# Patient Record
Sex: Female | Born: 2000 | Race: White | Hispanic: No | Marital: Single | Smoking: Never smoker
Health system: Southern US, Community
[De-identification: ages and names within clinical notes are randomized; demographics above are authoritative.]

## PROBLEM LIST (undated history)

## (undated) DIAGNOSIS — E538 Deficiency of other specified B group vitamins: Secondary | ICD-10-CM

## (undated) DIAGNOSIS — G43909 Migraine, unspecified, not intractable, without status migrainosus: Secondary | ICD-10-CM

## (undated) HISTORY — PX: NO PAST SURGERIES: SHX2092

## (undated) HISTORY — DX: Deficiency of other specified B group vitamins: E53.8

---
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
## 2018-01-21 ENCOUNTER — Encounter

## 2018-01-21 ENCOUNTER — Ambulatory Visit: Payer: Medicaid Other | Admitting: Neurology

## 2019-01-29 IMAGING — CT CT HEAD W/O CM
3 series · 16 of 47 positions shown, 19 images · non-contrast
Comparison: None.

CLINICAL DATA: Ataxia.  Suspect stroke.

EXAM:
CT HEAD WITHOUT CONTRAST
TECHNIQUE: Contiguous axial images were obtained from the base of the skull
through the vertex without intravenous contrast.

[Series 2: head wo · axial · 0.42mm/px · z∈[-207,-67]mm · 10 of 34 slices shown, 13 images]
[im 3/34  brain]
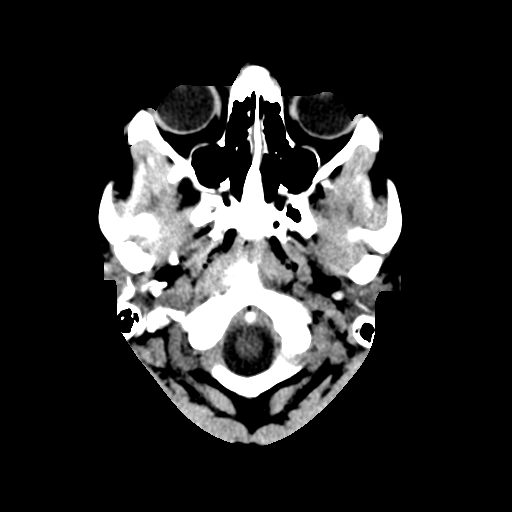
[im 3/34  bone]
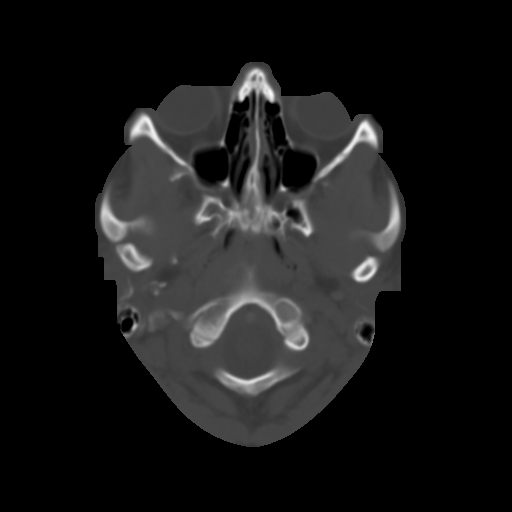
[im 6/34  brain]
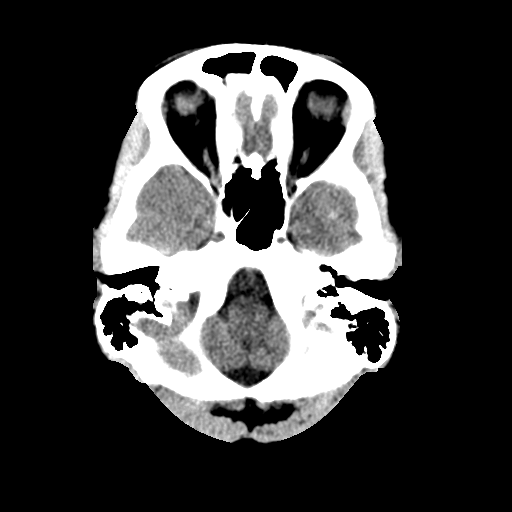
[im 10/34  brain]
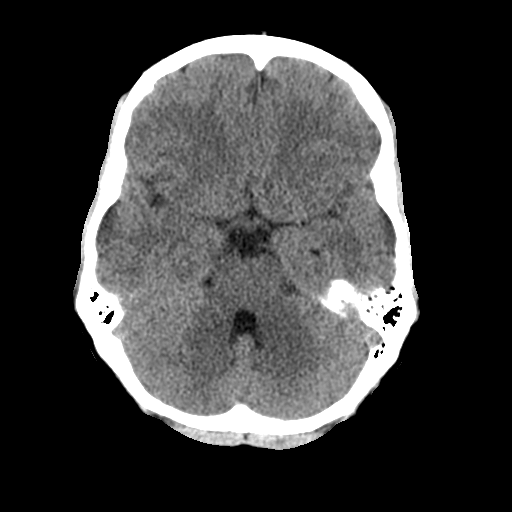
[im 12/34  brain]
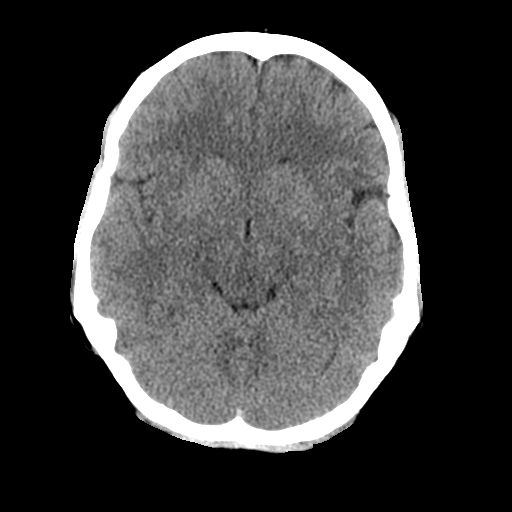
[im 15/34  brain]
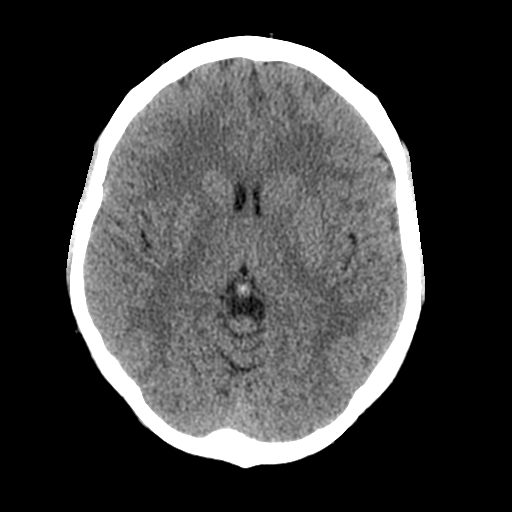
[im 15/34  bone]
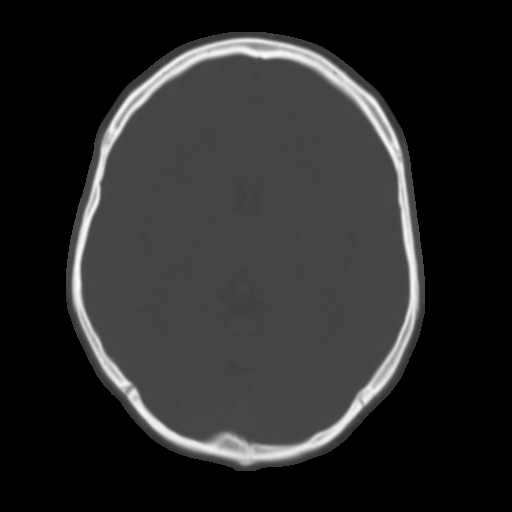
[im 19/34  brain]
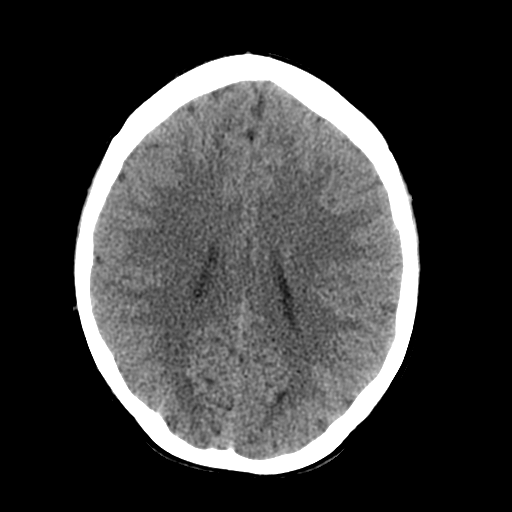
[im 22/34  brain]
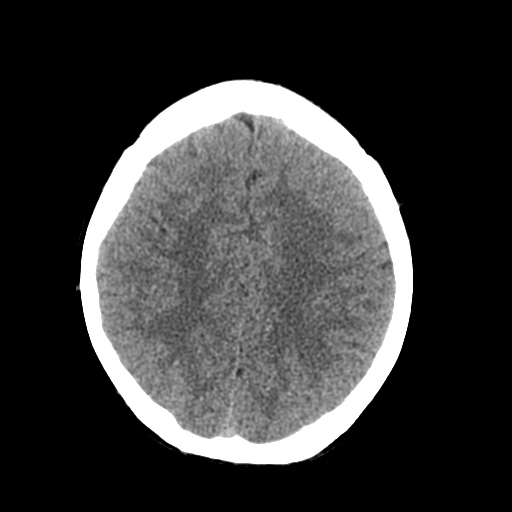
[im 26/34  brain]
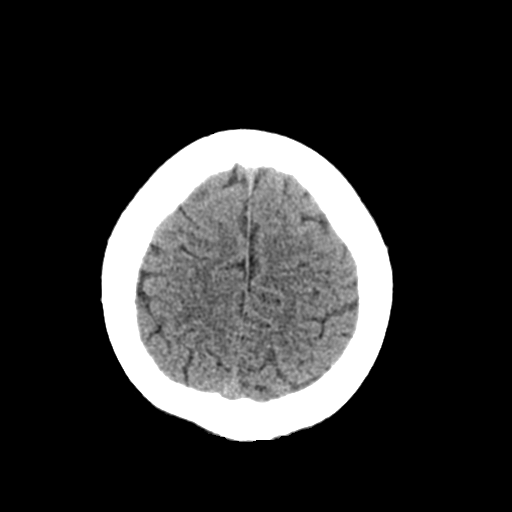
[im 28/34  brain]
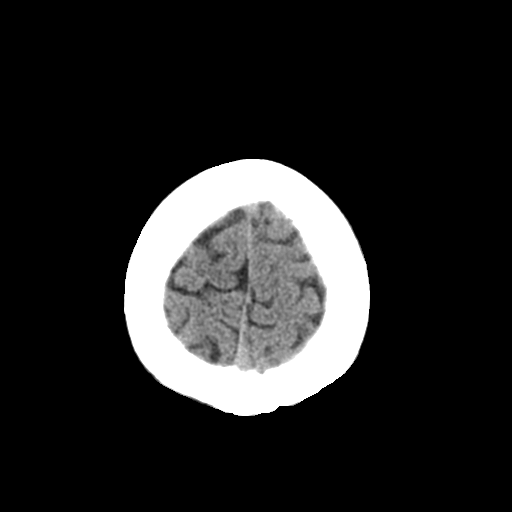
[im 28/34  bone]
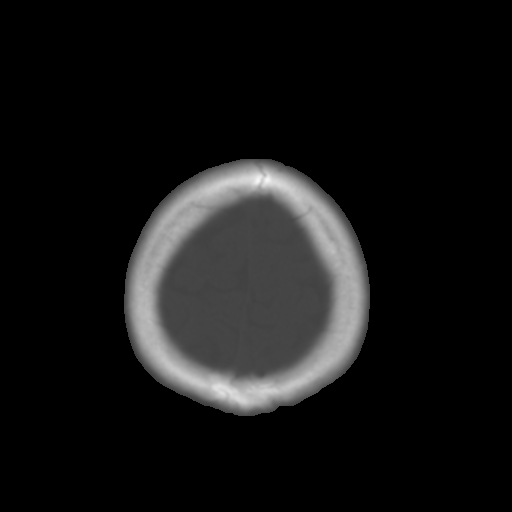
[im 31/34  brain]
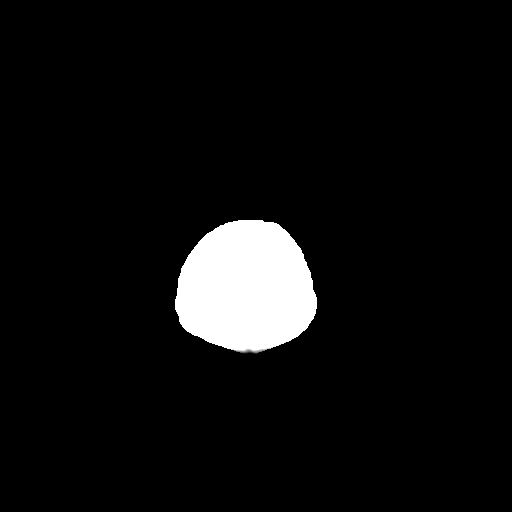

[Series 5: coronal soft tissue · coronal · 0.31mm/px · 3 of 64 slices shown]
[im 22/64  brain]
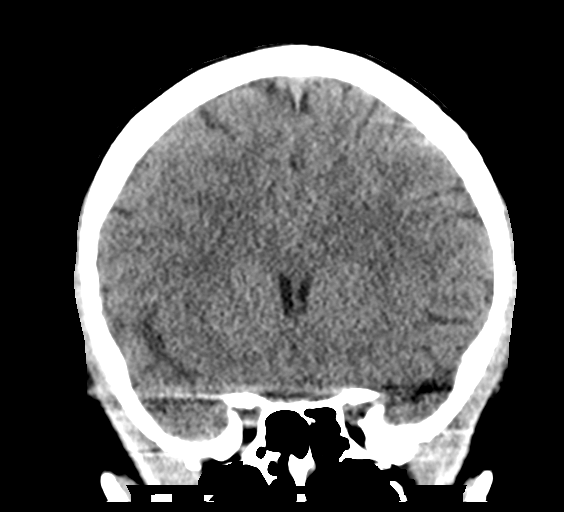
[im 29/64  brain]
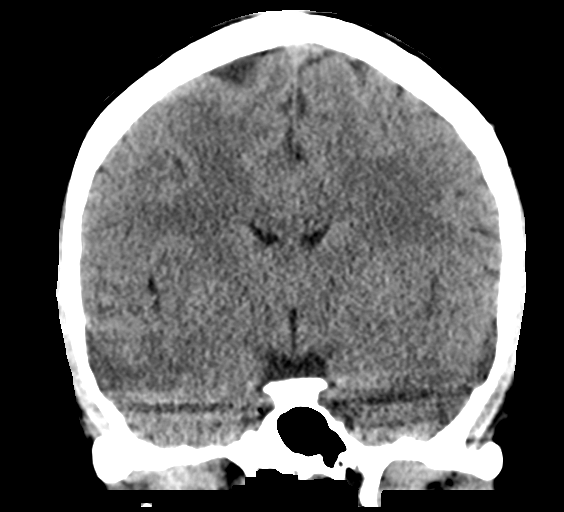
[im 36/64  brain]
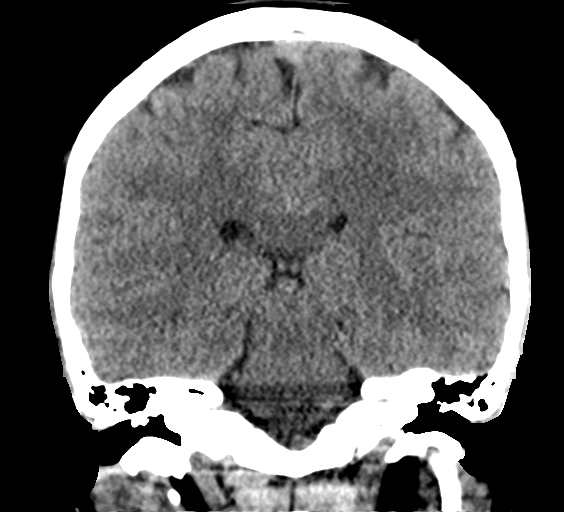

[Series 6: sagittal soft tissue · sagittal · 0.33mm/px · 3 of 57 slices shown]
[im 19/57  brain]
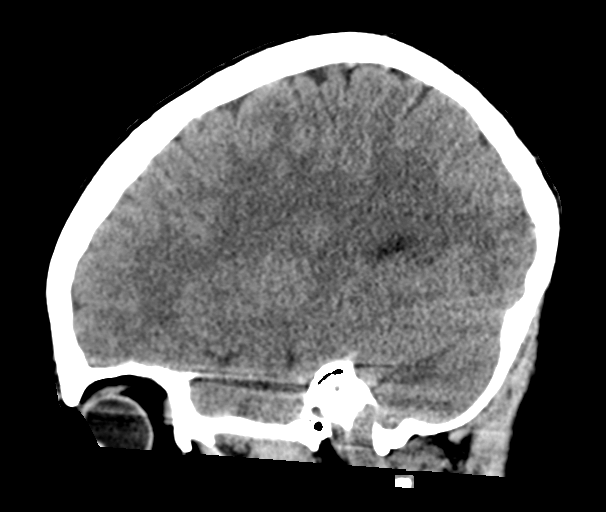
[im 29/57  brain]
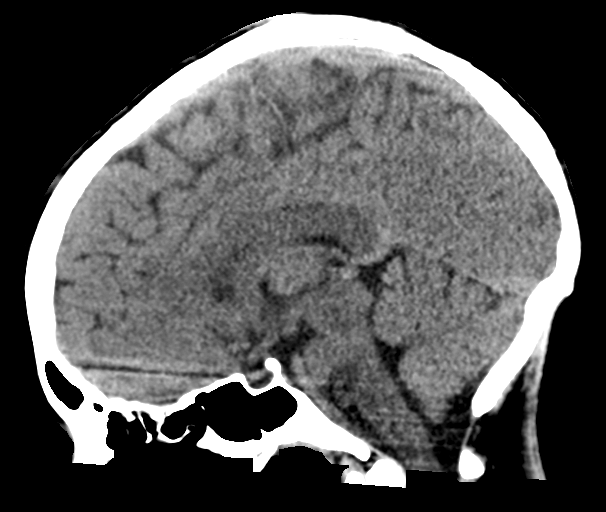
[im 38/57  brain]
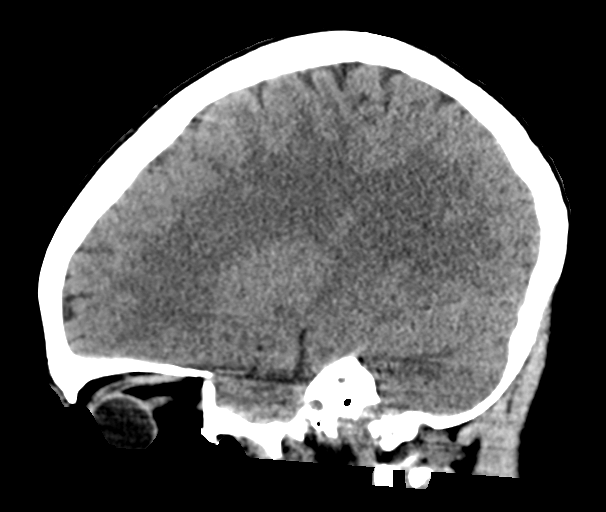

[16 of 47 positions shown; findings below may reference images not displayed]

FINDINGS: Brain: No evidence of acute infarction, hemorrhage, hydrocephalus,
extra-axial collection or mass lesion/mass effect.

Vascular: Negative for hyperdense vessel

Skull: Negative

Sinuses/Orbits: Negative

Other: None
IMPRESSION: Negative CT head
